# Patient Record
Sex: Male | Born: 1960 | Race: White | Hispanic: No | Smoking: Current every day smoker
Health system: Southern US, Community
[De-identification: ages and names within clinical notes are randomized; demographics above are authoritative.]

## PROBLEM LIST (undated history)

## (undated) DIAGNOSIS — J439 Emphysema, unspecified: Secondary | ICD-10-CM

## (undated) HISTORY — DX: Emphysema, unspecified: J43.9

---

## 2002-10-13 ENCOUNTER — Emergency Department (HOSPITAL_COMMUNITY): Admission: EM | Admit: 2002-10-13 | Discharge: 2002-10-13 | Payer: Self-pay | Admitting: Emergency Medicine

## 2002-10-13 ENCOUNTER — Encounter: Payer: Self-pay | Admitting: Emergency Medicine

## 2005-01-16 ENCOUNTER — Inpatient Hospital Stay (HOSPITAL_COMMUNITY): Admission: EM | Admit: 2005-01-16 | Discharge: 2005-01-18 | Payer: Self-pay | Admitting: *Deleted

## 2005-01-30 ENCOUNTER — Inpatient Hospital Stay (HOSPITAL_COMMUNITY): Admission: EM | Admit: 2005-01-30 | Discharge: 2005-02-03 | Payer: Self-pay | Admitting: Emergency Medicine

## 2005-01-30 ENCOUNTER — Ambulatory Visit: Payer: Self-pay | Admitting: Physical Medicine & Rehabilitation

## 2006-04-06 ENCOUNTER — Encounter: Payer: Self-pay | Admitting: Emergency Medicine

## 2020-08-13 ENCOUNTER — Emergency Department (HOSPITAL_COMMUNITY): Payer: Self-pay

## 2020-08-13 ENCOUNTER — Other Ambulatory Visit: Payer: Self-pay

## 2020-08-13 ENCOUNTER — Emergency Department (HOSPITAL_COMMUNITY)
Admission: EM | Admit: 2020-08-13 | Discharge: 2020-08-14 | Disposition: A | Discharge status: Home / Self Care | Payer: Self-pay | Attending: Emergency Medicine | Admitting: Emergency Medicine

## 2020-08-13 DIAGNOSIS — Y998 Other external cause status: Secondary | ICD-10-CM | POA: Insufficient documentation

## 2020-08-13 DIAGNOSIS — S40811A Abrasion of right upper arm, initial encounter: Secondary | ICD-10-CM

## 2020-08-13 DIAGNOSIS — S0101XA Laceration without foreign body of scalp, initial encounter: Secondary | ICD-10-CM | POA: Insufficient documentation

## 2020-08-13 DIAGNOSIS — S2242XA Multiple fractures of ribs, left side, initial encounter for closed fracture: Secondary | ICD-10-CM

## 2020-08-13 DIAGNOSIS — S161XXA Strain of muscle, fascia and tendon at neck level, initial encounter: Secondary | ICD-10-CM

## 2020-08-13 DIAGNOSIS — Y929 Unspecified place or not applicable: Secondary | ICD-10-CM | POA: Insufficient documentation

## 2020-08-13 DIAGNOSIS — S0990XA Unspecified injury of head, initial encounter: Secondary | ICD-10-CM

## 2020-08-13 DIAGNOSIS — S40011A Contusion of right shoulder, initial encounter: Secondary | ICD-10-CM

## 2020-08-13 DIAGNOSIS — Y939 Activity, unspecified: Secondary | ICD-10-CM | POA: Insufficient documentation

## 2020-08-13 MED ORDER — LIDOCAINE HCL (PF) 1 % IJ SOLN
5.0000 mL | Freq: Once | INTRAMUSCULAR | Status: DC
  Filled 2020-08-13: qty 5

## 2020-08-13 MED ORDER — MORPHINE SULFATE (PF) 4 MG/ML IV SOLN
4.0000 mg | Freq: Once | INTRAVENOUS | Status: AC
  Administered 2020-08-13: 4 mg via INTRAVENOUS
  Filled 2020-08-13: qty 1

## 2020-08-13 MED ORDER — ONDANSETRON HCL 4 MG/2ML IJ SOLN
4.0000 mg | Freq: Once | INTRAMUSCULAR | Status: AC
  Administered 2020-08-13: 4 mg via INTRAVENOUS
  Filled 2020-08-13: qty 2

## 2020-08-13 MED ORDER — SODIUM CHLORIDE 0.9 % IV BOLUS
1000.0000 mL | Freq: Once | INTRAVENOUS | Status: AC
  Administered 2020-08-13: 1000 mL via INTRAVENOUS
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
## 2020-08-14 ENCOUNTER — Emergency Department (HOSPITAL_COMMUNITY): Payer: Self-pay

## 2020-08-14 LAB — BASIC METABOLIC PANEL
Anion gap: 14 (ref 5–15)
BUN: 5 mg/dL — ABNORMAL LOW (ref 6–20)
CO2: 21 mmol/L — ABNORMAL LOW (ref 22–32)
Calcium: 7.9 mg/dL — ABNORMAL LOW (ref 8.9–10.3)
Chloride: 103 mmol/L (ref 98–111)
Creatinine, Ser: 0.69 mg/dL (ref 0.61–1.24)
GFR calc Af Amer: >60 mL/min (ref >60)
GFR calc non Af Amer: >60 mL/min (ref >60)
Glucose, Bld: 92 mg/dL (ref 70–99)
Potassium: 3.8 mmol/L (ref 3.5–5.1)
Sodium: 138 mmol/L (ref 135–145)

## 2020-08-14 LAB — CBC WITH DIFFERENTIAL/PLATELET
Abs Immature Granulocytes: 0.05 10*3/uL (ref 0.00–0.07)
Basophils Absolute: 0.1 10*3/uL (ref 0.0–0.1)
Basophils Relative: 1 %
Eosinophils Absolute: 0.2 10*3/uL (ref 0.0–0.5)
Eosinophils Relative: 3 %
HCT: 43.8 % (ref 39.0–52.0)
Hemoglobin: 15.4 g/dL (ref 13.0–17.0)
Immature Granulocytes: 1 %
Lymphocytes Relative: 27 %
Lymphs Abs: 1.8 10*3/uL (ref 0.7–4.0)
MCH: 39.6 pg — ABNORMAL HIGH (ref 26.0–34.0)
MCHC: 35.2 g/dL (ref 30.0–36.0)
MCV: 112.6 fL — ABNORMAL HIGH (ref 80.0–100.0)
Monocytes Absolute: 0.8 10*3/uL (ref 0.1–1.0)
Monocytes Relative: 12 %
Neutro Abs: 3.6 10*3/uL (ref 1.7–7.7)
Neutrophils Relative %: 56 %
Platelets: 221 10*3/uL (ref 150–400)
RBC: 3.89 MIL/uL — ABNORMAL LOW (ref 4.22–5.81)
RDW: 13.2 % (ref 11.5–15.5)
WBC: 6.4 10*3/uL (ref 4.0–10.5)
nRBC: 0.3 % — ABNORMAL HIGH (ref 0.0–0.2)

## 2020-08-14 LAB — ETHANOL: Alcohol, Ethyl (B): 221 mg/dL — ABNORMAL HIGH (ref <10)

## 2020-08-14 MED ORDER — IOHEXOL 300 MG/ML  SOLN
100.0000 mL | Freq: Once | INTRAMUSCULAR | Status: AC | PRN
  Administered 2020-08-14: 100 mL via INTRAVENOUS

## 2020-08-14 MED ORDER — OXYCODONE-ACETAMINOPHEN 5-325 MG PO TABS: 1.0000 | ORAL_TABLET | Freq: Four times a day (QID) | ORAL | 0 refills | Status: DC | PRN

## 2020-08-14 MED ORDER — LIDOCAINE HCL (PF) 1 % IJ SOLN
INTRAMUSCULAR | Status: AC
  Filled 2020-08-14: qty 5

## 2020-08-14 MED ORDER — MORPHINE SULFATE (PF) 4 MG/ML IV SOLN
INTRAVENOUS | Status: AC
Start: 2020-08-14 — End: 2020-08-14
  Filled 2020-08-14: qty 1

## 2020-08-14 NOTE — ED Notes (Signed)
Patient transported to CT 

## 2020-08-14 NOTE — Discharge Instructions (Signed)
Begin taking Percocet as prescribed as needed for pain.  Rest.  Ice for 20 minutes every 2 hours while awake for the next 2 days.  Local wound care with bacitracin and dressing changes twice daily.  Staples are to be removed in 5 to 6 days.  Please follow-up with your primary doctor or urgent care for this.  Return to the emergency department if you develop any new and/or concerning symptoms.
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
## 2023-04-23 ENCOUNTER — Emergency Department (HOSPITAL_COMMUNITY): Payer: Self-pay

## 2023-04-23 ENCOUNTER — Encounter (HOSPITAL_COMMUNITY): Payer: Self-pay

## 2023-04-23 ENCOUNTER — Other Ambulatory Visit: Payer: Self-pay

## 2023-04-23 ENCOUNTER — Inpatient Hospital Stay (HOSPITAL_COMMUNITY)
Admission: EM | Admit: 2023-04-23 | Discharge: 2023-04-26 | DRG: 184 | Disposition: A | Discharge status: Home / Self Care | Payer: Self-pay | Attending: General Surgery | Admitting: General Surgery

## 2023-04-23 DIAGNOSIS — S2243XA Multiple fractures of ribs, bilateral, initial encounter for closed fracture: Secondary | ICD-10-CM | POA: Diagnosis not present

## 2023-04-23 DIAGNOSIS — I4891 Unspecified atrial fibrillation: Secondary | ICD-10-CM | POA: Diagnosis present

## 2023-04-23 DIAGNOSIS — F17210 Nicotine dependence, cigarettes, uncomplicated: Secondary | ICD-10-CM | POA: Diagnosis present

## 2023-04-23 DIAGNOSIS — E876 Hypokalemia: Secondary | ICD-10-CM

## 2023-04-23 DIAGNOSIS — Y906 Blood alcohol level of 120-199 mg/100 ml: Secondary | ICD-10-CM | POA: Diagnosis present

## 2023-04-23 DIAGNOSIS — M79602 Pain in left arm: Secondary | ICD-10-CM | POA: Diagnosis not present

## 2023-04-23 DIAGNOSIS — R202 Paresthesia of skin: Secondary | ICD-10-CM | POA: Diagnosis present

## 2023-04-23 DIAGNOSIS — R001 Bradycardia, unspecified: Secondary | ICD-10-CM | POA: Diagnosis not present

## 2023-04-23 DIAGNOSIS — R200 Anesthesia of skin: Secondary | ICD-10-CM | POA: Diagnosis present

## 2023-04-23 DIAGNOSIS — F1010 Alcohol abuse, uncomplicated: Secondary | ICD-10-CM

## 2023-04-23 DIAGNOSIS — F1020 Alcohol dependence, uncomplicated: Secondary | ICD-10-CM | POA: Diagnosis present

## 2023-04-23 DIAGNOSIS — S2249XA Multiple fractures of ribs, unspecified side, initial encounter for closed fracture: Secondary | ICD-10-CM | POA: Diagnosis present

## 2023-04-23 DIAGNOSIS — I340 Nonrheumatic mitral (valve) insufficiency: Secondary | ICD-10-CM | POA: Diagnosis present

## 2023-04-23 DIAGNOSIS — Y92410 Unspecified street and highway as the place of occurrence of the external cause: Secondary | ICD-10-CM

## 2023-04-23 DIAGNOSIS — K561 Intussusception: Secondary | ICD-10-CM

## 2023-04-23 LAB — COMPREHENSIVE METABOLIC PANEL
ALT: 19 U/L (ref 0–44)
AST: 35 U/L (ref 15–41)
Albumin: 3.3 g/dL — ABNORMAL LOW (ref 3.5–5.0)
Alkaline Phosphatase: 61 U/L (ref 38–126)
Anion gap: 10 (ref 5–15)
BUN: 5 mg/dL — ABNORMAL LOW (ref 8–23)
CO2: 26 mmol/L (ref 22–32)
Calcium: 8.2 mg/dL — ABNORMAL LOW (ref 8.9–10.3)
Chloride: 103 mmol/L (ref 98–111)
Creatinine, Ser: 0.61 mg/dL (ref 0.61–1.24)
GFR, Estimated: >60 mL/min (ref >60)
Glucose, Bld: 111 mg/dL — ABNORMAL HIGH (ref 70–99)
Potassium: 2.4 mmol/L — CL (ref 3.5–5.1)
Sodium: 139 mmol/L (ref 135–145)
Total Bilirubin: 0.5 mg/dL (ref 0.3–1.2)
Total Protein: 6.9 g/dL (ref 6.5–8.1)

## 2023-04-23 LAB — I-STAT CHEM 8, ED
BUN: 6 mg/dL — ABNORMAL LOW (ref 8–23)
Calcium, Ion: 1.04 mmol/L — ABNORMAL LOW (ref 1.15–1.40)
Chloride: 101 mmol/L (ref 98–111)
Creatinine, Ser: 0.9 mg/dL (ref 0.61–1.24)
Glucose, Bld: 111 mg/dL — ABNORMAL HIGH (ref 70–99)
HCT: 44 % (ref 39.0–52.0)
Hemoglobin: 15 g/dL (ref 13.0–17.0)
Potassium: 2.6 mmol/L — CL (ref 3.5–5.1)
Sodium: 143 mmol/L (ref 135–145)
TCO2: 25 mmol/L (ref 22–32)

## 2023-04-23 LAB — CBC
HCT: 39.7 % (ref 39.0–52.0)
Hemoglobin: 13.8 g/dL (ref 13.0–17.0)
MCH: 37.3 pg — ABNORMAL HIGH (ref 26.0–34.0)
MCHC: 34.8 g/dL (ref 30.0–36.0)
MCV: 107.3 fL — ABNORMAL HIGH (ref 80.0–100.0)
Platelets: 240 10*3/uL (ref 150–400)
RBC: 3.7 MIL/uL — ABNORMAL LOW (ref 4.22–5.81)
RDW: 14.1 % (ref 11.5–15.5)
WBC: 4.6 10*3/uL (ref 4.0–10.5)
nRBC: 0 % (ref 0.0–0.2)

## 2023-04-23 LAB — PROTIME-INR
INR: 1.1 (ref 0.8–1.2)
Prothrombin Time: 14.5 seconds (ref 11.4–15.2)

## 2023-04-23 LAB — SAMPLE TO BLOOD BANK

## 2023-04-23 LAB — AMMONIA: Ammonia: 45 umol/L — ABNORMAL HIGH (ref 9–35)

## 2023-04-23 LAB — MAGNESIUM: Magnesium: 1.8 mg/dL (ref 1.7–2.4)

## 2023-04-23 MED ORDER — MORPHINE SULFATE (PF) 4 MG/ML IV SOLN
4.0000 mg | Freq: Once | INTRAVENOUS | Status: AC
  Administered 2023-04-23: 4 mg via INTRAVENOUS
  Filled 2023-04-23: qty 1

## 2023-04-23 MED ORDER — LIDOCAINE 5 % EX PTCH
1.0000 | MEDICATED_PATCH | CUTANEOUS | Status: DC
  Administered 2023-04-23 – 2023-04-25 (×3): 1 via TRANSDERMAL
  Filled 2023-04-23 (×3): qty 1

## 2023-04-23 MED ORDER — SODIUM CHLORIDE 0.9 % IV BOLUS
1000.0000 mL | Freq: Once | INTRAVENOUS | Status: AC
  Administered 2023-04-23: 1000 mL via INTRAVENOUS

## 2023-04-23 MED ORDER — POTASSIUM CHLORIDE 10 MEQ/100ML IV SOLN
10.0000 meq | INTRAVENOUS | Status: AC
  Administered 2023-04-23 – 2023-04-24 (×3): 10 meq via INTRAVENOUS
  Filled 2023-04-23 (×3): qty 100

## 2023-04-23 MED ORDER — IOHEXOL 350 MG/ML SOLN
75.0000 mL | Freq: Once | INTRAVENOUS | Status: AC | PRN
  Administered 2023-04-23: 75 mL via INTRAVENOUS

## 2023-04-23 MED ORDER — POTASSIUM CHLORIDE 20 MEQ PO PACK
80.0000 meq | PACK | ORAL | Status: AC
  Administered 2023-04-23: 80 meq via ORAL
  Filled 2023-04-23: qty 4

## 2023-04-23 MED ORDER — ONDANSETRON HCL 4 MG/2ML IJ SOLN
4.0000 mg | Freq: Once | INTRAMUSCULAR | Status: AC
  Administered 2023-04-23: 4 mg via INTRAVENOUS
  Filled 2023-04-23: qty 2

## 2023-04-23 MED ORDER — LACTATED RINGERS IV BOLUS: 1000.0000 mL | Freq: Once | INTRAVENOUS | Status: DC

## 2023-04-23 NOTE — ED Notes (Signed)
Sister Steward Drone [phone] would like an update asap
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
## 2023-04-23 NOTE — ED Triage Notes (Signed)
Pt reports loss of sensation to his L arm and feet. Pt does move arm with painful stimulus

## 2023-04-23 NOTE — ED Notes (Signed)
Spencer Chapman brother:  [phone]

## 2023-04-23 NOTE — ED Notes (Signed)
I stat chem 8 results given to EDP and nurse.

## 2023-04-24 DIAGNOSIS — I4891 Unspecified atrial fibrillation: Secondary | ICD-10-CM | POA: Diagnosis present

## 2023-04-24 DIAGNOSIS — Y906 Blood alcohol level of 120-199 mg/100 ml: Secondary | ICD-10-CM | POA: Diagnosis present

## 2023-04-24 DIAGNOSIS — F17210 Nicotine dependence, cigarettes, uncomplicated: Secondary | ICD-10-CM | POA: Diagnosis present

## 2023-04-24 DIAGNOSIS — R202 Paresthesia of skin: Secondary | ICD-10-CM | POA: Diagnosis present

## 2023-04-24 DIAGNOSIS — S2243XA Multiple fractures of ribs, bilateral, initial encounter for closed fracture: Secondary | ICD-10-CM | POA: Diagnosis present

## 2023-04-24 DIAGNOSIS — F1020 Alcohol dependence, uncomplicated: Secondary | ICD-10-CM | POA: Diagnosis present

## 2023-04-24 DIAGNOSIS — Y92410 Unspecified street and highway as the place of occurrence of the external cause: Secondary | ICD-10-CM | POA: Diagnosis not present

## 2023-04-24 DIAGNOSIS — S2249XA Multiple fractures of ribs, unspecified side, initial encounter for closed fracture: Secondary | ICD-10-CM | POA: Diagnosis present

## 2023-04-24 DIAGNOSIS — K561 Intussusception: Secondary | ICD-10-CM | POA: Diagnosis present

## 2023-04-24 DIAGNOSIS — R200 Anesthesia of skin: Secondary | ICD-10-CM | POA: Diagnosis present

## 2023-04-24 DIAGNOSIS — E876 Hypokalemia: Secondary | ICD-10-CM | POA: Diagnosis present

## 2023-04-24 DIAGNOSIS — M79602 Pain in left arm: Secondary | ICD-10-CM | POA: Diagnosis present

## 2023-04-24 DIAGNOSIS — R001 Bradycardia, unspecified: Secondary | ICD-10-CM | POA: Diagnosis not present

## 2023-04-24 DIAGNOSIS — I340 Nonrheumatic mitral (valve) insufficiency: Secondary | ICD-10-CM | POA: Diagnosis present

## 2023-04-24 LAB — URINALYSIS, ROUTINE W REFLEX MICROSCOPIC
Bilirubin Urine: NEGATIVE
Glucose, UA: NEGATIVE mg/dL
Hgb urine dipstick: NEGATIVE
Ketones, ur: NEGATIVE mg/dL
Leukocytes,Ua: NEGATIVE
Nitrite: NEGATIVE
Protein, ur: NEGATIVE mg/dL
Specific Gravity, Urine: 1.036 — ABNORMAL HIGH (ref 1.005–1.030)
pH: 6 (ref 5.0–8.0)

## 2023-04-24 LAB — BASIC METABOLIC PANEL
Anion gap: 9 (ref 5–15)
BUN: 6 mg/dL — ABNORMAL LOW (ref 8–23)
CO2: 24 mmol/L (ref 22–32)
Calcium: 7.9 mg/dL — ABNORMAL LOW (ref 8.9–10.3)
Chloride: 107 mmol/L (ref 98–111)
Creatinine, Ser: 0.56 mg/dL — ABNORMAL LOW (ref 0.61–1.24)
GFR, Estimated: >60 mL/min (ref >60)
Glucose, Bld: 92 mg/dL (ref 70–99)
Potassium: 4.2 mmol/L (ref 3.5–5.1)
Sodium: 140 mmol/L (ref 135–145)

## 2023-04-24 LAB — RAPID URINE DRUG SCREEN, HOSP PERFORMED
Amphetamines: NOT DETECTED
Barbiturates: NOT DETECTED
Benzodiazepines: NOT DETECTED
Cocaine: NOT DETECTED
Opiates: POSITIVE — AB
Tetrahydrocannabinol: NOT DETECTED

## 2023-04-24 LAB — CBC
HCT: 34.5 % — ABNORMAL LOW (ref 39.0–52.0)
Hemoglobin: 12 g/dL — ABNORMAL LOW (ref 13.0–17.0)
MCH: 37.2 pg — ABNORMAL HIGH (ref 26.0–34.0)
MCHC: 34.8 g/dL (ref 30.0–36.0)
MCV: 106.8 fL — ABNORMAL HIGH (ref 80.0–100.0)
Platelets: 211 10*3/uL (ref 150–400)
RBC: 3.23 MIL/uL — ABNORMAL LOW (ref 4.22–5.81)
RDW: 14.2 % (ref 11.5–15.5)
WBC: 7.5 10*3/uL (ref 4.0–10.5)
nRBC: 0 % (ref 0.0–0.2)

## 2023-04-24 LAB — HIV ANTIBODY (ROUTINE TESTING W REFLEX): HIV Screen 4th Generation wRfx: NONREACTIVE

## 2023-04-24 LAB — PHOSPHORUS: Phosphorus: 3.8 mg/dL (ref 2.5–4.6)

## 2023-04-24 LAB — ETHANOL: Alcohol, Ethyl (B): 166 mg/dL — ABNORMAL HIGH (ref <10)

## 2023-04-24 LAB — MAGNESIUM: Magnesium: 1.6 mg/dL — ABNORMAL LOW (ref 1.7–2.4)

## 2023-04-24 MED ORDER — POLYETHYLENE GLYCOL 3350 17 G PO PACK: 17.0000 g | PACK | Freq: Every day | ORAL | Status: DC | PRN

## 2023-04-24 MED ORDER — ACETAMINOPHEN 325 MG PO TABS: 650.0000 mg | ORAL_TABLET | Freq: Four times a day (QID) | ORAL | Status: DC

## 2023-04-24 MED ORDER — PANTOPRAZOLE SODIUM 40 MG PO TBEC
40.0000 mg | DELAYED_RELEASE_TABLET | Freq: Every day | ORAL | Status: DC
  Administered 2023-04-24 – 2023-04-26 (×3): 40 mg via ORAL
  Filled 2023-04-24 (×3): qty 1

## 2023-04-24 MED ORDER — ADULT MULTIVITAMIN W/MINERALS CH
1.0000 | ORAL_TABLET | Freq: Every day | ORAL | Status: DC
  Administered 2023-04-24 – 2023-04-26 (×3): 1 via ORAL
  Filled 2023-04-24 (×3): qty 1

## 2023-04-24 MED ORDER — ONDANSETRON 4 MG PO TBDP: 4.0000 mg | ORAL_TABLET | Freq: Four times a day (QID) | ORAL | Status: DC | PRN

## 2023-04-24 MED ORDER — DOCUSATE SODIUM 100 MG PO CAPS
100.0000 mg | ORAL_CAPSULE | Freq: Two times a day (BID) | ORAL | Status: DC
  Administered 2023-04-24 – 2023-04-26 (×5): 100 mg via ORAL
  Filled 2023-04-24 (×5): qty 1

## 2023-04-24 MED ORDER — LORAZEPAM 2 MG/ML IJ SOLN: 1.0000 mg | INTRAMUSCULAR | Status: DC | PRN

## 2023-04-24 MED ORDER — SIMETHICONE 80 MG PO CHEW: 80.0000 mg | CHEWABLE_TABLET | Freq: Four times a day (QID) | ORAL | Status: DC | PRN

## 2023-04-24 MED ORDER — METOPROLOL TARTRATE 5 MG/5ML IV SOLN: 5.0000 mg | Freq: Four times a day (QID) | INTRAVENOUS | Status: DC | PRN

## 2023-04-24 MED ORDER — METHOCARBAMOL 1000 MG/10ML IJ SOLN
500.0000 mg | Freq: Three times a day (TID) | INTRAVENOUS | Status: DC
  Filled 2023-04-24: qty 5

## 2023-04-24 MED ORDER — PROCHLORPERAZINE EDISYLATE 10 MG/2ML IJ SOLN: 10.0000 mg | INTRAMUSCULAR | Status: DC | PRN

## 2023-04-24 MED ORDER — HYDRALAZINE HCL 20 MG/ML IJ SOLN: 10.0000 mg | INTRAMUSCULAR | Status: DC | PRN

## 2023-04-24 MED ORDER — METHOCARBAMOL 500 MG PO TABS
500.0000 mg | ORAL_TABLET | Freq: Three times a day (TID) | ORAL | Status: DC
  Administered 2023-04-24 – 2023-04-26 (×8): 500 mg via ORAL
  Filled 2023-04-24 (×8): qty 1

## 2023-04-24 MED ORDER — ACETAMINOPHEN 500 MG PO TABS
1000.0000 mg | ORAL_TABLET | Freq: Four times a day (QID) | ORAL | Status: DC
  Administered 2023-04-24 – 2023-04-26 (×9): 1000 mg via ORAL
  Filled 2023-04-24 (×10): qty 2

## 2023-04-24 MED ORDER — OXYCODONE HCL 5 MG PO TABS
10.0000 mg | ORAL_TABLET | ORAL | Status: DC | PRN
  Filled 2023-04-24: qty 2

## 2023-04-24 MED ORDER — METHOCARBAMOL 1000 MG/10ML IJ SOLN: 500.0000 mg | Freq: Four times a day (QID) | INTRAVENOUS | Status: DC | PRN

## 2023-04-24 MED ORDER — ONDANSETRON HCL 4 MG/2ML IJ SOLN: 4.0000 mg | Freq: Four times a day (QID) | INTRAMUSCULAR | Status: DC | PRN

## 2023-04-24 MED ORDER — FOLIC ACID 1 MG PO TABS
1.0000 mg | ORAL_TABLET | Freq: Every day | ORAL | Status: DC
  Administered 2023-04-24 – 2023-04-26 (×3): 1 mg via ORAL
  Filled 2023-04-24 (×3): qty 1

## 2023-04-24 MED ORDER — THIAMINE MONONITRATE 100 MG PO TABS
100.0000 mg | ORAL_TABLET | Freq: Every day | ORAL | Status: DC
  Administered 2023-04-24 – 2023-04-26 (×3): 100 mg via ORAL
  Filled 2023-04-24 (×3): qty 1

## 2023-04-24 MED ORDER — HYDROMORPHONE HCL 1 MG/ML IJ SOLN: 1.0000 mg | INTRAMUSCULAR | Status: DC | PRN

## 2023-04-24 MED ORDER — KETOROLAC TROMETHAMINE 15 MG/ML IJ SOLN
15.0000 mg | Freq: Three times a day (TID) | INTRAMUSCULAR | Status: DC
  Administered 2023-04-24 – 2023-04-26 (×8): 15 mg via INTRAVENOUS
  Filled 2023-04-24 (×8): qty 1

## 2023-04-24 MED ORDER — METOCLOPRAMIDE HCL 5 MG/ML IJ SOLN
10.0000 mg | Freq: Four times a day (QID) | INTRAMUSCULAR | Status: DC
  Administered 2023-04-24 (×2): 10 mg via INTRAVENOUS
  Filled 2023-04-24 (×2): qty 2

## 2023-04-24 MED ORDER — THIAMINE HCL 100 MG/ML IJ SOLN
100.0000 mg | Freq: Every day | INTRAMUSCULAR | Status: DC
  Filled 2023-04-24: qty 2

## 2023-04-24 MED ORDER — ENOXAPARIN SODIUM 30 MG/0.3ML IJ SOSY
30.0000 mg | PREFILLED_SYRINGE | Freq: Two times a day (BID) | INTRAMUSCULAR | Status: DC
  Administered 2023-04-25 – 2023-04-26 (×3): 30 mg via SUBCUTANEOUS
  Filled 2023-04-24 (×3): qty 0.3

## 2023-04-24 MED ORDER — GABAPENTIN 300 MG PO CAPS
300.0000 mg | ORAL_CAPSULE | Freq: Three times a day (TID) | ORAL | Status: DC
  Administered 2023-04-24: 300 mg via ORAL
  Filled 2023-04-24: qty 1

## 2023-04-24 MED ORDER — DOCUSATE SODIUM 100 MG PO CAPS: 100.0000 mg | ORAL_CAPSULE | Freq: Two times a day (BID) | ORAL | Status: DC

## 2023-04-24 MED ORDER — LORAZEPAM 1 MG PO TABS: 1.0000 mg | ORAL_TABLET | ORAL | Status: DC | PRN

## 2023-04-24 MED ORDER — OXYCODONE HCL 5 MG PO TABS
5.0000 mg | ORAL_TABLET | ORAL | Status: DC | PRN
  Administered 2023-04-25 – 2023-04-26 (×2): 5 mg via ORAL
  Filled 2023-04-24: qty 1

## 2023-04-24 MED ORDER — PANTOPRAZOLE SODIUM 40 MG IV SOLR
40.0000 mg | Freq: Every day | INTRAVENOUS | Status: DC
  Filled 2023-04-24: qty 10
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
## 2023-04-24 NOTE — Progress Notes (Signed)
Patient was transferred  to the  floor from ED,alert and oriented,able to verbalize needs,the patient assessment was done,made comfortable in room, patient had money in the amount of $110 in his wallet counted with patient and informed the charge nurse,bed in low position and call light in reach,will continue to monitor.
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
## 2023-04-25 ENCOUNTER — Inpatient Hospital Stay (HOSPITAL_COMMUNITY): Payer: Self-pay

## 2023-04-25 DIAGNOSIS — R011 Cardiac murmur, unspecified: Secondary | ICD-10-CM

## 2023-04-25 DIAGNOSIS — I4891 Unspecified atrial fibrillation: Secondary | ICD-10-CM

## 2023-04-25 DIAGNOSIS — I491 Atrial premature depolarization: Secondary | ICD-10-CM

## 2023-04-25 DIAGNOSIS — Z720 Tobacco use: Secondary | ICD-10-CM

## 2023-04-25 DIAGNOSIS — F1010 Alcohol abuse, uncomplicated: Secondary | ICD-10-CM

## 2023-04-25 LAB — BASIC METABOLIC PANEL
Anion gap: 7 (ref 5–15)
BUN: 11 mg/dL (ref 8–23)
CO2: 24 mmol/L (ref 22–32)
Calcium: 8.1 mg/dL — ABNORMAL LOW (ref 8.9–10.3)
Chloride: 102 mmol/L (ref 98–111)
Creatinine, Ser: 0.71 mg/dL (ref 0.61–1.24)
GFR, Estimated: >60 mL/min (ref >60)
Glucose, Bld: 93 mg/dL (ref 70–99)
Potassium: 3.8 mmol/L (ref 3.5–5.1)
Sodium: 133 mmol/L — ABNORMAL LOW (ref 135–145)

## 2023-04-25 LAB — PHOSPHORUS: Phosphorus: 2.9 mg/dL (ref 2.5–4.6)

## 2023-04-25 LAB — ECHOCARDIOGRAM COMPLETE
AR max vel: 2.08 cm2
AV Area VTI: 2.17 cm2
AV Area mean vel: 2.09 cm2
AV Mean grad: 3 mmHg
AV Peak grad: 6.5 mmHg
Ao pk vel: 1.27 m/s
Area-P 1/2: 3.28 cm2
Calc EF: 58.6 %
MV VTI: 1.35 cm2
S' Lateral: 3.1 cm
Single Plane A2C EF: 58.3 %
Single Plane A4C EF: 58 %
Weight: 1777.79 oz

## 2023-04-25 LAB — CBC
HCT: 33.1 % — ABNORMAL LOW (ref 39.0–52.0)
Hemoglobin: 11.5 g/dL — ABNORMAL LOW (ref 13.0–17.0)
MCH: 36.7 pg — ABNORMAL HIGH (ref 26.0–34.0)
MCHC: 34.7 g/dL (ref 30.0–36.0)
MCV: 105.8 fL — ABNORMAL HIGH (ref 80.0–100.0)
Platelets: 183 10*3/uL (ref 150–400)
RBC: 3.13 MIL/uL — ABNORMAL LOW (ref 4.22–5.81)
RDW: 13.8 % (ref 11.5–15.5)
WBC: 7.9 10*3/uL (ref 4.0–10.5)
nRBC: 0 % (ref 0.0–0.2)

## 2023-04-25 LAB — MAGNESIUM: Magnesium: 1.5 mg/dL — ABNORMAL LOW (ref 1.7–2.4)

## 2023-04-25 MED ORDER — MAGNESIUM SULFATE 4 GM/100ML IV SOLN
4.0000 g | Freq: Once | INTRAVENOUS | Status: AC
  Administered 2023-04-25: 4 g via INTRAVENOUS
  Filled 2023-04-25: qty 100

## 2023-04-25 MED ORDER — SPIRITUS FRUMENTI
1.0000 | Freq: Once | ORAL | Status: DC
  Filled 2023-04-25 (×2): qty 1

## 2023-04-25 NOTE — Progress Notes (Signed)
ECHO being performed on pt in the room.
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
## 2023-04-25 NOTE — TOC CAGE-AID Note (Signed)
Transition of Care [facility]) - CAGE-AID Screening  Patient Details  Name: Spencer Chapman MRN: [id number] Date of Birth: 06/17/1961  Clinical Narrative:  Patient endorses drinking "a lot every day", has never had withdrawal symptoms. Patient feels he should probably cut down. Denies any illicit drug use. Resources offered.  CAGE-AID Screening:    Have You Ever Felt You Ought to Cut Down on Your Drinking or Drug Use?: Yes Have People Annoyed You By Critizing Your Drinking Or Drug Use?: No Have You Felt Bad Or Guilty About Your Drinking Or Drug Use?: No Have You Ever Had a Drink or Used Drugs First Thing In The Morning to Steady Your Nerves or to Get Rid of a Hangover?: No CAGE-AID Score: 1  Substance Abuse Education Offered: Yes
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
## 2023-04-26 ENCOUNTER — Inpatient Hospital Stay: Payer: Self-pay

## 2023-04-26 ENCOUNTER — Other Ambulatory Visit (HOSPITAL_COMMUNITY): Payer: Self-pay

## 2023-04-26 ENCOUNTER — Other Ambulatory Visit: Payer: Self-pay | Admitting: Home Health

## 2023-04-26 ENCOUNTER — Encounter: Payer: Self-pay | Admitting: *Deleted

## 2023-04-26 DIAGNOSIS — I491 Atrial premature depolarization: Secondary | ICD-10-CM

## 2023-04-26 DIAGNOSIS — I340 Nonrheumatic mitral (valve) insufficiency: Secondary | ICD-10-CM

## 2023-04-26 DIAGNOSIS — I4891 Unspecified atrial fibrillation: Secondary | ICD-10-CM

## 2023-04-26 DIAGNOSIS — I341 Nonrheumatic mitral (valve) prolapse: Secondary | ICD-10-CM

## 2023-04-26 LAB — CBC
HCT: 33.8 % — ABNORMAL LOW (ref 39.0–52.0)
Hemoglobin: 12.1 g/dL — ABNORMAL LOW (ref 13.0–17.0)
MCH: 37.9 pg — ABNORMAL HIGH (ref 26.0–34.0)
MCHC: 35.8 g/dL (ref 30.0–36.0)
MCV: 106 fL — ABNORMAL HIGH (ref 80.0–100.0)
Platelets: 181 10*3/uL (ref 150–400)
RBC: 3.19 MIL/uL — ABNORMAL LOW (ref 4.22–5.81)
RDW: 13.8 % (ref 11.5–15.5)
WBC: 6.9 10*3/uL (ref 4.0–10.5)
nRBC: 0 % (ref 0.0–0.2)

## 2023-04-26 LAB — BASIC METABOLIC PANEL
Anion gap: 7 (ref 5–15)
BUN: 15 mg/dL (ref 8–23)
CO2: 24 mmol/L (ref 22–32)
Calcium: 8 mg/dL — ABNORMAL LOW (ref 8.9–10.3)
Chloride: 100 mmol/L (ref 98–111)
Creatinine, Ser: 0.75 mg/dL (ref 0.61–1.24)
GFR, Estimated: >60 mL/min (ref >60)
Glucose, Bld: 89 mg/dL (ref 70–99)
Potassium: 3.6 mmol/L (ref 3.5–5.1)
Sodium: 131 mmol/L — ABNORMAL LOW (ref 135–145)

## 2023-04-26 LAB — MAGNESIUM: Magnesium: 2 mg/dL (ref 1.7–2.4)

## 2023-04-26 MED ORDER — ACETAMINOPHEN 500 MG PO TABS: 1000.0000 mg | ORAL_TABLET | Freq: Four times a day (QID) | ORAL | 0 refills | Status: DC | PRN

## 2023-04-26 MED ORDER — METHOCARBAMOL 500 MG PO TABS
500.0000 mg | ORAL_TABLET | Freq: Three times a day (TID) | ORAL | 0 refills | Status: DC | PRN
  Filled 2023-04-26: qty 30, 10d supply, fill #0

## 2023-04-26 MED ORDER — OXYCODONE HCL 5 MG PO TABS
5.0000 mg | ORAL_TABLET | Freq: Four times a day (QID) | ORAL | 0 refills | Status: DC | PRN
  Filled 2023-04-26: qty 20, 5d supply, fill #0

## 2023-04-26 MED ORDER — MULTI-VITAMIN/MINERALS PO TABS
1.0000 | ORAL_TABLET | Freq: Every day | ORAL | 3 refills | Status: DC
  Filled 2023-04-26: qty 90, 90d supply, fill #0

## 2023-04-26 NOTE — Progress Notes (Unsigned)
Enrolled for [employer] to mail a ZIO XT long term holter monitor to the patients address on file.   Letter with instructions mailed to patient. 
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
## 2023-04-26 NOTE — Plan of Care (Signed)
  Problem: Clinical Measurements: Goal: Will remain free from infection Outcome: Progressing   Problem: Clinical Measurements: Goal: Respiratory complications will improve Outcome: Progressing   Problem: Clinical Measurements: Goal: Cardiovascular complication will be avoided Outcome: Progressing   Problem: Pain Managment: Goal: General experience of comfort will improve Outcome: Progressing   
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
## 2023-04-26 NOTE — Progress Notes (Signed)
Pt to discharge home today,...... have reached out to cardiology (secure chat)  to find out if pt will have ZIO patch placed here at the hospital or if will be placed as outpatient at the office.  Once this is clarified pt can be discharged according to the plan of the ZIO patch.  Await response.   Annice Needy, RN SWOT
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
## 2023-04-26 NOTE — Progress Notes (Signed)
Cardiology responded back, plan is for cardiology office to call the pt about the zio patch and mail it to the pt per NP,Xika Zhao.  This information was placed on the d/c instructions and discussed with the pt and significant other at the bedside.   Discharge instructions reviewed with pt and his significant other.   Copy of instructions given to pt. [facility] filled scripts and meds will be delivered to the pt in his room. Pt and significant other plan to call a taxi to take them home.  Pt to be d/c'd via wheelchair with belongings, with significant other.            To be escorted by staff.  Pablo Stauffer,RN SWOT
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
## 2023-04-27 ENCOUNTER — Telehealth: Payer: Self-pay | Admitting: *Deleted

## 2023-04-27 ENCOUNTER — Telehealth: Payer: Self-pay | Admitting: Radiology

## 2023-04-27 NOTE — Telephone Encounter (Signed)
We received an email from [facility] stating patients address in invalid in the [facility] system. I called patient and was unable to verify address.   ......... Good afternoon,  Our office received a ZIO XT Home Enrollment order from  office in which the address is reflecting invalid in USPS address verification system:  The address for this contact is not valid or could not be verified. USPS Details: Address Not Found.   Patients Initials: KM Patient [id number] Ticket:  We have reached out to the patient and the account. Please give [place] a call to confirm the patient address so we can proceed with shipping this order to the patient. IF YOU NEED ANY ADDITIONAL INFORMATION PLEASE CALL [employer] Customer Service [phone].  Thank you, [employer] Customer Support

## 2023-04-27 NOTE — Telephone Encounter (Signed)
Good afternoon,  Our office received a ZIO XT Home Enrollment order from  office in which the address is reflecting invalid in USPS address verification system:  The address for this contact is not valid or could not be verified. USPS Details: Address Not Found.   Patients Initials: KM Patient [id number] Ticket:  We have reached out to the patient and the account. Please give [place] a call to confirm the patient address so we can proceed with shipping this order to the patient. IF YOU NEED ANY ADDITIONAL INFORMATION PLEASE CALL [employer] Customer Service [phone].  Thank you, [employer] Customer Support    ref:[email or web address]:ref

## 2023-05-04 ENCOUNTER — Telehealth (INDEPENDENT_AMBULATORY_CARE_PROVIDER_SITE_OTHER): Payer: Self-pay | Admitting: Primary Care

## 2023-05-04 NOTE — Telephone Encounter (Signed)
Unable to leave VM

## 2023-05-05 ENCOUNTER — Inpatient Hospital Stay (INDEPENDENT_AMBULATORY_CARE_PROVIDER_SITE_OTHER): Payer: Self-pay | Admitting: Primary Care

## 2023-07-20 ENCOUNTER — Other Ambulatory Visit (INDEPENDENT_AMBULATORY_CARE_PROVIDER_SITE_OTHER): Payer: Medicaid Other

## 2023-07-20 ENCOUNTER — Encounter: Payer: Self-pay | Admitting: Family Medicine

## 2023-07-20 ENCOUNTER — Ambulatory Visit: Payer: Medicaid Other | Admitting: Family Medicine

## 2023-07-20 VITALS — BP 130/70 | HR 66 | Temp 98.6°F | Ht 69.0 in | Wt 115.0 lb

## 2023-07-20 DIAGNOSIS — Z131 Encounter for screening for diabetes mellitus: Secondary | ICD-10-CM

## 2023-07-20 DIAGNOSIS — F17210 Nicotine dependence, cigarettes, uncomplicated: Secondary | ICD-10-CM

## 2023-07-20 DIAGNOSIS — Z1159 Encounter for screening for other viral diseases: Secondary | ICD-10-CM

## 2023-07-20 DIAGNOSIS — I341 Nonrheumatic mitral (valve) prolapse: Secondary | ICD-10-CM

## 2023-07-20 DIAGNOSIS — R29898 Other symptoms and signs involving the musculoskeletal system: Secondary | ICD-10-CM | POA: Diagnosis not present

## 2023-07-20 DIAGNOSIS — I499 Cardiac arrhythmia, unspecified: Secondary | ICD-10-CM

## 2023-07-20 DIAGNOSIS — Z114 Encounter for screening for human immunodeficiency virus [HIV]: Secondary | ICD-10-CM

## 2023-07-20 DIAGNOSIS — I700 Atherosclerosis of aorta: Secondary | ICD-10-CM

## 2023-07-20 DIAGNOSIS — J439 Emphysema, unspecified: Secondary | ICD-10-CM | POA: Diagnosis not present

## 2023-07-20 DIAGNOSIS — R600 Localized edema: Secondary | ICD-10-CM | POA: Diagnosis not present

## 2023-07-20 DIAGNOSIS — Z125 Encounter for screening for malignant neoplasm of prostate: Secondary | ICD-10-CM

## 2023-07-20 DIAGNOSIS — Z23 Encounter for immunization: Secondary | ICD-10-CM | POA: Diagnosis not present

## 2023-07-20 DIAGNOSIS — Z1211 Encounter for screening for malignant neoplasm of colon: Secondary | ICD-10-CM

## 2023-07-20 DIAGNOSIS — Z7689 Persons encountering health services in other specified circumstances: Secondary | ICD-10-CM

## 2023-07-20 DIAGNOSIS — Z13220 Encounter for screening for lipoid disorders: Secondary | ICD-10-CM

## 2023-07-20 DIAGNOSIS — Z1329 Encounter for screening for other suspected endocrine disorder: Secondary | ICD-10-CM

## 2023-07-20 NOTE — Assessment & Plan Note (Signed)
Weakness and numbness of bilateral lower extremities and hands. Will refer for nerve conduction studies and will consider MRI spine. He has known L1 compression fracture.

## 2023-07-20 NOTE — Assessment & Plan Note (Signed)
Today we reviewed your PMH and current concerns. Health maintenance items discussed. Fasting labs drawn. Return to office in 1 week for complete physical.

## 2023-07-20 NOTE — Assessment & Plan Note (Signed)
Discussed findings on recent CT. Will optimize risk factors. Encouraged smoking cessation and limiting ETOH. Fasting labs drawn today. BP well controlled.
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
## 2023-07-20 NOTE — Assessment & Plan Note (Signed)
3-5 minute discussion regarding the harms of tobacco use, the benefits of cessation, and methods of cessation. Discussed that there are medication options to help with cessation. Provided printed education on steps to quit smoking. Patient is not ready for a medication to help.  

## 2023-07-20 NOTE — Assessment & Plan Note (Signed)
NSR with PACs on EKG in office. Consistent with findings during recent ED visit. Will reorder zio as it appears it was sent to wrong address. Provided sister with number to contact cardiology office for follow-up. Updated phone nmber and address in EPIC. Murmur on exam, recent echo showed mitral valve prolapse and recommended TEE. Will refer to cardiology. No chest pain, palpitatoins, shortness of breath, vision changes, recurerent headaches. He does endorse BLE swelling, will collect BNP in addition to standard labs.

## 2023-07-21 ENCOUNTER — Other Ambulatory Visit: Payer: Self-pay | Admitting: Family Medicine

## 2023-07-21 ENCOUNTER — Ambulatory Visit: Payer: Medicaid Other | Attending: Family Medicine

## 2023-07-21 ENCOUNTER — Encounter: Payer: Self-pay | Admitting: *Deleted

## 2023-07-21 DIAGNOSIS — I4891 Unspecified atrial fibrillation: Secondary | ICD-10-CM

## 2023-07-21 DIAGNOSIS — I499 Cardiac arrhythmia, unspecified: Secondary | ICD-10-CM

## 2023-07-21 DIAGNOSIS — I491 Atrial premature depolarization: Secondary | ICD-10-CM

## 2023-07-21 LAB — COMPLETE METABOLIC PANEL WITH GFR
AG Ratio: 1.1 (calc) (ref 1.0–2.5)
ALT: 12 U/L (ref 9–46)
AST: 31 U/L (ref 10–35)
Albumin: 3.6 g/dL (ref 3.6–5.1)
Alkaline phosphatase (APISO): 71 U/L (ref 35–144)
BUN/Creatinine Ratio: 8 (calc) (ref 6–22)
BUN: 5 mg/dL — ABNORMAL LOW (ref 7–25)
CO2: 28 mmol/L (ref 20–32)
Calcium: 8.9 mg/dL (ref 8.6–10.3)
Chloride: 102 mmol/L (ref 98–110)
Creat: 0.62 mg/dL — ABNORMAL LOW (ref 0.70–1.35)
Globulin: 3.2 g/dL (ref 1.9–3.7)
Glucose, Bld: 81 mg/dL (ref 65–99)
Potassium: 3.8 mmol/L (ref 3.5–5.3)
Sodium: 140 mmol/L (ref 135–146)
Total Bilirubin: 0.6 mg/dL (ref 0.2–1.2)
Total Protein: 6.8 g/dL (ref 6.1–8.1)
eGFR: 107 mL/min/{1.73_m2} (ref >=60)

## 2023-07-21 LAB — CBC WITH DIFFERENTIAL/PLATELET
Absolute Monocytes: 576 {cells}/uL (ref 200–950)
Basophils Absolute: 92 {cells}/uL (ref 0–200)
Basophils Relative: 1.8 %
Eosinophils Absolute: 306 {cells}/uL (ref 15–500)
Eosinophils Relative: 6 %
HCT: 39.8 % (ref 38.5–50.0)
Hemoglobin: 13.5 g/dL (ref 13.2–17.1)
Lymphs Abs: 1923 {cells}/uL (ref 850–3900)
MCH: 35.9 pg — ABNORMAL HIGH (ref 27.0–33.0)
MCHC: 33.9 g/dL (ref 32.0–36.0)
MCV: 105.9 fL — ABNORMAL HIGH (ref 80.0–100.0)
MPV: 10.8 fL (ref 7.5–12.5)
Monocytes Relative: 11.3 %
Neutro Abs: 2203 {cells}/uL (ref 1500–7800)
Neutrophils Relative %: 43.2 %
Platelets: 249 10*3/uL (ref 140–400)
RBC: 3.76 10*6/uL — ABNORMAL LOW (ref 4.20–5.80)
RDW: 13.8 % (ref 11.0–15.0)
Total Lymphocyte: 37.7 %
WBC: 5.1 10*3/uL (ref 3.8–10.8)

## 2023-07-21 LAB — TSH: TSH: 1.84 m[IU]/L (ref 0.40–4.50)

## 2023-07-21 LAB — LIPID PANEL
Cholesterol: 137 mg/dL (ref <200)
HDL: 66 mg/dL (ref >=40)
LDL Cholesterol (Calc): 55 mg/dL
Non-HDL Cholesterol (Calc): 71 mg/dL (ref <130)
Total CHOL/HDL Ratio: 2.1 (calc) (ref <5.0)
Triglycerides: 78 mg/dL (ref <150)

## 2023-07-21 LAB — HEMOGLOBIN A1C
Hgb A1c MFr Bld: 5 %{Hb} (ref <5.7)
Mean Plasma Glucose: 97 mg/dL
eAG (mmol/L): 5.4 mmol/L

## 2023-07-21 LAB — PSA: PSA: 0.86 ng/mL (ref <=4.00)

## 2023-07-21 LAB — BRAIN NATRIURETIC PEPTIDE: Brain Natriuretic Peptide: 147 pg/mL — ABNORMAL HIGH (ref <100)

## 2023-07-21 NOTE — Progress Notes (Unsigned)
Enrolled for [employer] to mail a ZIO XT long term holter monitor to the patients address on file.   Dr. Chandrasekhar to read. 

## 2023-07-21 NOTE — Addendum Note (Signed)
Addended by: Park Meo on: 07/21/2023 02:29 PM   Modules accepted: Orders

## 2023-07-25 ENCOUNTER — Encounter (INDEPENDENT_AMBULATORY_CARE_PROVIDER_SITE_OTHER): Payer: Self-pay | Admitting: *Deleted

## 2023-07-26 ENCOUNTER — Telehealth: Payer: Self-pay | Admitting: Family Medicine

## 2023-07-26 NOTE — Telephone Encounter (Signed)
No contact number for patient on file. Referral coordinator Bonita Quin requested patient's phone number for Thayer Ohm to schedule appointment re: referral on file.   Outbound call placed to patient's emergency contact number of [phone] [facility]). Man who answered stated he doesn't know anyone named Rishawn and doesn't know how we got his number.   Left message to return call on voicemail of other emergency contact (sister) Venda Rodes.

## 2023-07-28 DIAGNOSIS — I491 Atrial premature depolarization: Secondary | ICD-10-CM

## 2023-07-28 DIAGNOSIS — I499 Cardiac arrhythmia, unspecified: Secondary | ICD-10-CM

## 2023-07-28 DIAGNOSIS — I4891 Unspecified atrial fibrillation: Secondary | ICD-10-CM

## 2023-08-02 ENCOUNTER — Telehealth (INDEPENDENT_AMBULATORY_CARE_PROVIDER_SITE_OTHER): Payer: Self-pay | Admitting: *Deleted

## 2023-08-02 NOTE — Telephone Encounter (Signed)
Referring MD/PCP: Kurtis Bushman  Insurance: Tourist information centre manager  Best Phone Number: [phone]  Reason for the colonoscopy screening  Has patient had this procedure before?  no  If so, when, by whom and where?    Is there a family history of colon cancer?  no  Who?  What age when diagnosed?    Is patient diabetic? If yes, Type 1 or Type 2   no      Does patient have prosthetic heart valve or mechanical valve?  no  Do you have a pacemaker/defibrillator?  no  Has patient ever had endocarditis/atrial fibrillation? no  Has patient had joint replacement within last 12 months?  no  Is patient constipated or do they take laxatives? no  Does patient have a history of alcohol/drug use?  Yes, alcohol  Does patient use oxygen? no  Have you had a stroke/heart attack last 6 mths? no  Do you take medicine for weight loss?  no  For male patients,: have you had a hysterectomy no                      are you post menopausal no                      do you still have your menstrual cycle no  Do you take any blood-thinning medications such as: (aspirin, warfarin, Plavix, Aggrenox)  no  If yes we need the name, milligram, dosage and who is prescribing doctor   Medications: none  Allergies: NKDA  [facility] [address]

## 2023-08-03 NOTE — Telephone Encounter (Signed)
 Ok to schedule.  Room 2  Thanks,  Vista Lawman, MD Gastroenterology and Hepatology [facility] Gastroenterology

## 2023-08-04 ENCOUNTER — Telehealth: Payer: Self-pay | Admitting: Internal Medicine

## 2023-08-04 NOTE — Telephone Encounter (Signed)
Called pt sister to sch f/u referral   After scheduling she stated that she was concerned about pt's heart monitor because it does not have a green light on it and she wanted to know if that means it hasn't been on. Please advise.

## 2023-08-04 NOTE — Telephone Encounter (Signed)
Green light will only flash 5-6 times when monitor initially turned on. If you missed seeing the green light, push the button, holding it down for 12 seconds.  You should see the green light flash again to indicate the monitor had been turned on.

## 2023-08-11 NOTE — Telephone Encounter (Signed)
Will call once we get Oct schedule

## 2023-09-01 NOTE — Telephone Encounter (Signed)
LMTCB for pt WITH SISTER

## 2023-09-09 ENCOUNTER — Other Ambulatory Visit: Payer: Self-pay | Admitting: Family Medicine

## 2023-09-09 DIAGNOSIS — Z1212 Encounter for screening for malignant neoplasm of rectum: Secondary | ICD-10-CM

## 2023-09-09 DIAGNOSIS — Z1211 Encounter for screening for malignant neoplasm of colon: Secondary | ICD-10-CM

## 2023-09-21 ENCOUNTER — Ambulatory Visit: Payer: Medicaid Other

## 2023-10-03 ENCOUNTER — Encounter (HOSPITAL_COMMUNITY): Payer: Self-pay

## 2023-10-03 ENCOUNTER — Ambulatory Visit (HOSPITAL_COMMUNITY)
Admission: EM | Admit: 2023-10-03 | Discharge: 2023-10-03 | Disposition: A | Discharge status: Home / Self Care | Payer: Medicaid Other | Attending: Internal Medicine | Admitting: Internal Medicine

## 2023-10-03 ENCOUNTER — Ambulatory Visit (INDEPENDENT_AMBULATORY_CARE_PROVIDER_SITE_OTHER): Payer: Medicaid Other

## 2023-10-03 DIAGNOSIS — M25462 Effusion, left knee: Secondary | ICD-10-CM | POA: Diagnosis not present

## 2023-10-03 DIAGNOSIS — S82002A Unspecified fracture of left patella, initial encounter for closed fracture: Secondary | ICD-10-CM | POA: Diagnosis not present

## 2023-10-03 MED ORDER — CEPHALEXIN 250 MG PO CAPS
250.0000 mg | ORAL_CAPSULE | Freq: Four times a day (QID) | ORAL | 0 refills | Status: AC
Start: 2023-10-03 — End: ?

## 2023-10-03 MED ORDER — HYDROCODONE-ACETAMINOPHEN 5-325 MG PO TABS: 1.0000 | ORAL_TABLET | Freq: Four times a day (QID) | ORAL | 0 refills | Status: AC | PRN

## 2023-10-03 NOTE — ED Provider Notes (Signed)
[facility]    CSN: [id number] Arrival date & time: 10/03/23  1234      History   Chief Complaint Chief Complaint  Patient presents with   Fall    HPI Spencer Chapman is a 62 y.o. male.   The history is provided by the patient.  Fall  Left knee pain, states he tripped over a wire struck his knee on a brick yesterday, crack.  Since then has had pain and swelling left knee, admits pain with bearing weight.  Notes redness and warmth.  Denies breaks in the skin, fever, chills.  Past Medical History:  Diagnosis Date   Emphysema of lung [facility])     Patient Active Problem List   Diagnosis Date Noted   Encounter to establish care with new doctor 07/20/2023   Aortic atherosclerosis (HCC) 07/20/2023   Pulmonary emphysema (HCC) 07/20/2023   Cigarette nicotine dependence without complication 07/20/2023   Weakness of both lower extremities 07/20/2023   Irregular heart rhythm 07/20/2023   Multiple rib fractures 04/24/2023    History reviewed. No pertinent surgical history.     Home Medications    Prior to Admission medications   Medication Sig Start Date End Date Taking? Authorizing Provider  cephALEXin (KEFLEX) 250 MG capsule Take 1 capsule (250 mg total) by mouth 4 (four) times daily. 10/03/23  Yes Meliton Rattan, PA  HYDROcodone-acetaminophen (NORCO/VICODIN) 5-325 MG tablet Take 1 tablet by mouth every 6 (six) hours as needed for up to 3 days for severe pain (pain score 7-10). 10/03/23 10/06/23 Yes Meliton Rattan, PA    Family History History reviewed. No pertinent family history.  Social History Social History   Tobacco Use   Smoking status: Every Day    Current packs/day: 1.00    Types: Cigarettes   Smokeless tobacco: Never  Substance Use Topics   Alcohol use: Yes    Alcohol/week: 21.0 standard drinks of alcohol    Types: 21 Cans of beer per week   Drug use: Never     Allergies   Patient has no known allergies.   Review of Systems Review  of Systems  Constitutional:  Negative for fever.  Musculoskeletal:  Positive for arthralgias, gait problem and joint swelling.  Skin:  Positive for color change. Negative for wound.     Physical Exam Triage Vital Signs ED Triage Vitals  Encounter Vitals Group     BP 10/03/23 1334 (!) 155/88     Systolic BP Percentile --      Diastolic BP Percentile --      Pulse Rate 10/03/23 1334 76     Resp 10/03/23 1334 16     Temp 10/03/23 1334 98.3 F (36.8 C)     Temp Source 10/03/23 1334 Oral     SpO2 10/03/23 1334 97 %     Weight 10/03/23 1334 115 lb (52.2 kg)     Height 10/03/23 1334 5\' 9"  (1.753 m)     Head Circumference --      Peak Flow --      Pain Score 10/03/23 1333 10     Pain Loc --      Pain Education --      Exclude from Growth Chart --    No data found.  Updated Vital Signs BP (!) 155/88 (BP Location: Right Arm)   Pulse 76   Temp 98.3 F (36.8 C) (Oral)   Resp 16   Ht 5\' 9"  (1.753 m)   Wt 115 lb (52.2  kg)   SpO2 97%   BMI 16.98 kg/m   Visual Acuity Right Eye Distance:   Left Eye Distance:   Bilateral Distance:    Right Eye Near:   Left Eye Near:    Bilateral Near:     Physical Exam Vitals and nursing note reviewed.  HENT:     Head: Normocephalic.  Eyes:     Conjunctiva/sclera: Conjunctivae normal.  Cardiovascular:     Rate and Rhythm: Normal rate.  Pulmonary:     Effort: Pulmonary effort is normal. No respiratory distress.  Musculoskeletal:     Left knee: Swelling, effusion and bony tenderness present. Decreased range of motion. No LCL laxity or MCL laxity.Normal alignment.     Left lower leg: No swelling.  Skin:    General: Skin is warm.     Findings: Erythema (Left knee diffuse pink discoloration local warmth, no wounds noted) present.  Neurological:     Mental Status: He is alert.      UC Treatments / Results  Labs (all labs ordered are listed, but only abnormal results are displayed) Labs Reviewed - No data to  display  EKG   Radiology No results found.  Procedures Procedures (including critical care time)  Medications Ordered in UC Medications - No data to display  Initial Impression / Assessment and Plan / UC Course  I have reviewed the triage vital signs and the nursing notes.  Pertinent labs & imaging results that were available during my care of the patient were reviewed by me and considered in my medical decision making (see chart for details).    62 year old with left knee pain and swelling after fall.  Being his knee on a brick.  Left knee x-ray independently viewed by me patella fracture. Findings reviewed with patient, knee immobilizer and crutches fitted by staff, patient counseled to follow-up with Ortho ASAP given number specialist.  Rx antibiotics given mild redness and warmth. Final Clinical Impressions(s) / UC Diagnoses   Final diagnoses:  Closed displaced fracture of left patella, unspecified fracture morphology, initial encounter   Discharge Instructions   None    ED Prescriptions     Medication Sig Dispense Auth. Provider   cephALEXin (KEFLEX) 250 MG capsule Take 1 capsule (250 mg total) by mouth 4 (four) times daily. 28 capsule Meliton Rattan, [place]   HYDROcodone-acetaminophen (NORCO/VICODIN) 5-325 MG tablet Take 1 tablet by mouth every 6 (six) hours as needed for up to 3 days for severe pain (pain score 7-10). 12 tablet Meliton Rattan, [place]      I have reviewed the PDMP during this encounter.   Meliton Rattan, [place] 10/03/23 1428

## 2023-10-03 NOTE — ED Triage Notes (Signed)
Patient here today with c/o left knee pain after falling yesterday. Patient states that he tripped over a wire and his left knee hit a concrete block and he felt a crack.

## 2023-10-24 ENCOUNTER — Encounter: Payer: Self-pay | Admitting: Physician Assistant

## 2023-10-24 DIAGNOSIS — I471 Supraventricular tachycardia, unspecified: Secondary | ICD-10-CM

## 2023-10-24 DIAGNOSIS — I340 Nonrheumatic mitral (valve) insufficiency: Secondary | ICD-10-CM

## 2023-10-24 HISTORY — DX: Supraventricular tachycardia, unspecified: I47.10

## 2023-10-24 HISTORY — DX: Nonrheumatic mitral (valve) insufficiency: I34.0
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
## 2023-10-25 ENCOUNTER — Ambulatory Visit: Payer: Medicaid Other | Attending: Physician Assistant | Admitting: Physician Assistant

## 2023-10-25 DIAGNOSIS — I340 Nonrheumatic mitral (valve) insufficiency: Secondary | ICD-10-CM

## 2023-10-25 DIAGNOSIS — I471 Supraventricular tachycardia, unspecified: Secondary | ICD-10-CM

## 2023-10-26 ENCOUNTER — Encounter: Payer: Self-pay | Admitting: Physician Assistant

## 2023-11-16 ENCOUNTER — Encounter: Payer: Self-pay | Admitting: Emergency Medicine

## 2023-12-10 ENCOUNTER — Encounter (HOSPITAL_COMMUNITY): Payer: Self-pay | Admitting: *Deleted

## 2023-12-10 ENCOUNTER — Emergency Department (HOSPITAL_COMMUNITY): Payer: Medicaid Other

## 2023-12-10 ENCOUNTER — Emergency Department (HOSPITAL_COMMUNITY)
Admission: EM | Admit: 2023-12-10 | Discharge: 2023-12-10 | Disposition: A | Discharge status: Home / Self Care | Payer: Medicaid Other | Attending: Emergency Medicine | Admitting: Emergency Medicine

## 2023-12-10 ENCOUNTER — Other Ambulatory Visit: Payer: Self-pay

## 2023-12-10 DIAGNOSIS — S42402A Unspecified fracture of lower end of left humerus, initial encounter for closed fracture: Secondary | ICD-10-CM | POA: Diagnosis not present

## 2023-12-10 DIAGNOSIS — M79632 Pain in left forearm: Secondary | ICD-10-CM | POA: Diagnosis not present

## 2023-12-10 DIAGNOSIS — Z720 Tobacco use: Secondary | ICD-10-CM | POA: Diagnosis not present

## 2023-12-10 DIAGNOSIS — S52022A Displaced fracture of olecranon process without intraarticular extension of left ulna, initial encounter for closed fracture: Secondary | ICD-10-CM | POA: Diagnosis not present

## 2023-12-10 DIAGNOSIS — W010XXA Fall on same level from slipping, tripping and stumbling without subsequent striking against object, initial encounter: Secondary | ICD-10-CM | POA: Diagnosis not present

## 2023-12-10 DIAGNOSIS — M25522 Pain in left elbow: Secondary | ICD-10-CM | POA: Diagnosis present

## 2023-12-10 DIAGNOSIS — M79642 Pain in left hand: Secondary | ICD-10-CM | POA: Diagnosis not present

## 2023-12-10 DIAGNOSIS — M25422 Effusion, left elbow: Secondary | ICD-10-CM | POA: Diagnosis not present

## 2023-12-10 MED ORDER — HYDROCODONE-ACETAMINOPHEN 5-325 MG PO TABS
1.0000 | ORAL_TABLET | Freq: Once | ORAL | Status: AC
  Administered 2023-12-10: 1 via ORAL
  Filled 2023-12-10: qty 1

## 2023-12-10 MED ORDER — KETOROLAC TROMETHAMINE 15 MG/ML IJ SOLN
15.0000 mg | Freq: Once | INTRAMUSCULAR | Status: AC
  Administered 2023-12-10: 15 mg via INTRAMUSCULAR
  Filled 2023-12-10: qty 1

## 2023-12-10 MED ORDER — HYDROCODONE-ACETAMINOPHEN 5-325 MG PO TABS: 1.0000 | ORAL_TABLET | Freq: Four times a day (QID) | ORAL | 0 refills | Status: AC | PRN

## 2023-12-10 NOTE — ED Notes (Signed)
 Pt in xray

## 2023-12-10 NOTE — ED Provider Triage Note (Signed)
 Emergency Medicine Provider Triage Evaluation Note  Spencer Chapman , a 63 y.o. male  was evaluated in triage.  Pt complains of left elbow pain after trip and fall.  Review of Systems  Positive: Elbow pain Negative: Numbness, head injury  Physical Exam  BP 102/70 (BP Location: Right Arm)   Pulse 71   Temp 98.1 F (36.7 C)   Resp 19   Ht 5' 9 (1.753 m)   Wt 52.2 kg   SpO2 94%   BMI 16.98 kg/m  Gen:   Awake, no distress   Resp:  Normal effort  MSK:   Moves extremities without difficulty  Other:  Ecchymosis left elbow and forearm.  Normal range of motion left elbow and wrist.  Radial pulse intact.  Medical Decision Making  Medically screening exam initiated at 3:42 PM.  Appropriate orders placed.  Spencer Chapman was informed that the remainder of the evaluation will be completed by another provider, this initial triage assessment does not replace that evaluation, and the importance of remaining in the ED until their evaluation is complete.     Spencer Chapman LABOR, [place] 12/10/23 [phone]

## 2023-12-10 NOTE — ED Triage Notes (Signed)
 Pt fell the other day after tripping, pt with swelling and bruising noted to left forearm. Denies hitting his head.

## 2023-12-10 NOTE — ED Provider Notes (Signed)
 [place] EMERGENCY DEPARTMENT AT [facility] Provider Note   CSN: [id number] Arrival date & time: 12/10/23  1457     History  Chief Complaint  Patient presents with   Spencer Chapman is a 63 y.o. male.  He has PMH of SVT, mitral regurgitation, emphysema, tobacco abuse.  Presents the ER for left elbow pain with swelling of his forearm, states a couple of days ago he tripped and fell directly on his left elbow, has had worsening swelling and bruising now into his forearm since that time, not able to sleep due to the pain, denies numbness or tingling.  Denies limited range of motion.   Fall       Home Medications Prior to Admission medications   Medication Sig Start Date End Date Taking? Authorizing Provider  cephALEXin  (KEFLEX ) 250 MG capsule Take 1 capsule (250 mg total) by mouth 4 (four) times daily. 10/03/23   Acevedo, Angela, PA      Allergies    Patient has no known allergies.    Review of Systems   Review of Systems  Physical Exam Updated Vital Signs BP 102/70 (BP Location: Right Arm)   Pulse 71   Temp 98.1 F (36.7 C)   Resp 19   Ht 5' 9 (1.753 m)   Wt 52.2 kg   SpO2 94%   BMI 16.98 kg/m  Physical Exam Vitals and nursing note reviewed.  Constitutional:      General: He is not in acute distress.    Appearance: He is well-developed.  HENT:     Head: Normocephalic and atraumatic.     Mouth/Throat:     Mouth: Mucous membranes are moist.  Eyes:     Conjunctiva/sclera: Conjunctivae normal.  Cardiovascular:     Rate and Rhythm: Normal rate and regular rhythm.     Heart sounds: No murmur heard. Pulmonary:     Effort: Pulmonary effort is normal. No respiratory distress.     Breath sounds: Normal breath sounds.  Abdominal:     Palpations: Abdomen is soft.     Tenderness: There is no abdominal tenderness.  Musculoskeletal:        General: Swelling present.     Cervical back: Neck supple.     Comments: Swelling of left elbow and  ecchymosis of left forearm, tenderness to left forearm and dorsal left hand, no snuffbox tenderness of the wrist, normal range of motion of the wrist and elbow, no shoulder tenderness or limitation of range of motion of left shoulder.  Left radial pulses bounding.  Sensation intact throughout left upper extremity to light touch.  Skin:    General: Skin is warm and dry.     Capillary Refill: Capillary refill takes less than 2 seconds.  Neurological:     General: No focal deficit present.     Mental Status: He is alert and oriented to person, place, and time.  Psychiatric:        Mood and Affect: Mood normal.     ED Results / Procedures / Treatments   Labs (all labs ordered are listed, but only abnormal results are displayed) Labs Reviewed - No data to display  EKG None  Radiology DG Hand 2 View Left Result Date: 12/10/2023 CLINICAL DATA:  Pain after a fall. EXAM: LEFT HAND - 2 VIEW COMPARISON:  Forearm radiographs performed the same day. FINDINGS: There is no evidence of fracture or dislocation. There is no evidence of arthropathy or other focal  bone abnormality. Soft tissues are unremarkable. IMPRESSION: Negative. Electronically Signed   By: Norman Hopper M.D.   On: 12/10/2023 17:20   DG Elbow Complete Left Result Date: 12/10/2023 CLINICAL DATA:  Fall EXAM: LEFT ELBOW - COMPLETE 3+ VIEW COMPARISON:  None Available. FINDINGS: There is an acute olecranon fracture with fracture fragments distracted 7 mm. Joint effusion is present. There is no definite dislocation, although lateral view is limited secondary to patient positioning. There is diffuse soft tissue swelling of the elbow and visualized forearm. Joint spaces appear well maintained. There is a 2 x 2 x 2 mm radiopaque foreign body in the ventral soft tissues of the mid forearm. IMPRESSION: 1. Acute olecranon fracture. 2. Joint effusion. 3. Diffuse soft tissue swelling. 4. 2 mm radiopaque foreign body in the ventral soft tissues of the mid  forearm. Electronically Signed   By: Greig Pique M.D.   On: 12/10/2023 17:20   DG Forearm Left Result Date: 12/10/2023 CLINICAL DATA:  Fall with forearm pain and bruising. EXAM: LEFT FOREARM - 2 VIEW COMPARISON:  None Available. FINDINGS: There is a displaced acute fracture of the olecranon. There is likely an elbow joint effusion. There is soft tissue swelling surrounding the elbow. There is a 3 mm radiopaque foreign body in the soft tissues of the forearm. IMPRESSION: Displaced acute fracture of the olecranon. This can be further characterized with dedicated elbow radiographs. Electronically Signed   By: Norman Hopper M.D.   On: 12/10/2023 16:35    Procedures .Splint Application  Date/Time: 12/10/2023 6:59 PM  Performed by: Suellen Sherran LABOR, PA-C Authorized by: Suellen Sherran LABOR, PA-C   Consent:    Consent obtained:  Verbal   Consent given by:  Patient   Risks discussed:  Discoloration, numbness and swelling Universal protocol:    Patient identity confirmed:  Verbally with patient Pre-procedure details:    Distal neurologic exam:  Normal   Distal perfusion: distal pulses strong and brisk capillary refill   Procedure details:    Location:  Elbow   Elbow location:  L elbow   Strapping: no     Splint type:  Long arm   Supplies:  Elastic bandage, cotton padding, fiberglass and sling   Attestation: Splint applied and adjusted personally by me   Post-procedure details:    Distal neurologic exam:  Normal   Distal perfusion: distal pulses strong and brisk capillary refill     Procedure completion:  Tolerated well, no immediate complications     Medications Ordered in ED Medications - No data to display  ED Course/ Medical Decision Making/ A&P                                 Medical Decision Making This patient presents to the ED for concern of left elbow pain after fall, this involves an extensive number of treatment options, and is a complaint that carries with it a high risk  of complications and morbidity.  The differential diagnosis includes fracture, contusion, sprain, dislocation, other   Co morbidities that complicate the patient evaluation :   Emphysema, mitral regurgitation, smoking   Additional history obtained:  Additional history obtained from EMR External records from outside source obtained and reviewed including prior notes     Imaging Studies ordered:  I ordered imaging studies including x-ray left elbow, forearm and hand shows left olecranon fracture with 7 mm of distraction and joint effusion.  Soft tissue  foreign body of forearm, no fracture of the forearm or hand I independently visualized and interpreted imaging within scope of identifying emergent findings  I agree with the radiologist interpretation   Cardiac Monitoring: / EKG:  The patient was maintained on a cardiac monitor.  I personally viewed and interpreted the cardiac monitored which showed an underlying rhythm of: Sinus rhythm   Consultations Obtained:  I requested consultation with the with a peak surgeon Dr. Margrette,  and discussed lab and imaging findings as well as pertinent plan - they recommend: Follow-up in clinic-posterior splint   Problem List / ED Course / Critical interventions / Medication management  Left elbow fracture after mechanical fall several days ago shows 7 mm of distraction and a joint effusion but no dislocation, no ulnar nerve injury on exam, normal range of motion, normal hand range of motion and function patient to make thumbs up, okay and abduct and adduct fingers.  No decreased sensation.  He is not on blood thinners did not have a head strike.  Better than posterior splint with instructions for orthopedic follow-up and splint care.  Given Norco for pain as the pain has been keeping him awake at night.  Reports that he does fall a lot, given strict instructions to take minimal amounts of the hydrocodone  due to increased risk of falls, feeling  member at bedside instructed to watch him closely. I ordered medication including norco  for pain  Reevaluation of the patient after these medicines showed that the patient improved I have reviewed the patients home medicines and have made adjustments as needed       Amount and/or Complexity of Data Reviewed Radiology: ordered.  Risk Prescription drug management.           Final Clinical Impression(s) / ED Diagnoses Final diagnoses:  None    Rx / DC Orders ED Discharge Orders     None         Suellen Sherran DELENA DEVONNA 12/10/23 1950    Francesca Elsie CROME, MD 12/10/23 2117

## 2023-12-10 NOTE — Discharge Instructions (Addendum)
 Seen today for left elbow injury after a fall.  You were noted to have a fracture of your olecranon.  We put you in a splint and sling, keep your arm in the splint do not get it wet or take it off.  Is important for you to follow-up with orthopedics.  They can determine if you will need surgery to repair this.  X-ray did show small metallic foreign body in the soft tissue though this is likely old since he did not have any skin injuries.   You were prescribed opiate pain medication. This can have many sided effect such as drowsiness, slowed breathing, and conspitation. Do not take it with other medications that cause drowsiness, or drink alcohol  while taking it. Take a stool softener over the counter while taking it.  Since you have frequent falls please make sure you take this as little as possible.  Since they can make you sleepy can increase your risk of falls.

## 2023-12-15 ENCOUNTER — Other Ambulatory Visit: Payer: Self-pay

## 2023-12-15 DIAGNOSIS — Z1211 Encounter for screening for malignant neoplasm of colon: Secondary | ICD-10-CM

## 2023-12-26 ENCOUNTER — Ambulatory Visit: Payer: Medicaid Other | Admitting: Family Medicine

## 2024-01-25 ENCOUNTER — Encounter (INDEPENDENT_AMBULATORY_CARE_PROVIDER_SITE_OTHER): Payer: Self-pay | Admitting: *Deleted

## 2024-01-27 ENCOUNTER — Ambulatory Visit: Payer: Self-pay | Admitting: Family Medicine
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
## 2024-01-29 ENCOUNTER — Emergency Department (HOSPITAL_COMMUNITY): Payer: Medicaid Other

## 2024-01-29 ENCOUNTER — Other Ambulatory Visit: Payer: Self-pay

## 2024-01-29 ENCOUNTER — Emergency Department (HOSPITAL_COMMUNITY)
Admission: EM | Admit: 2024-01-29 | Discharge: 2024-01-29 | Disposition: A | Discharge status: Home / Self Care | Payer: Medicaid Other | Attending: Emergency Medicine | Admitting: Emergency Medicine

## 2024-01-29 DIAGNOSIS — M19032 Primary osteoarthritis, left wrist: Secondary | ICD-10-CM | POA: Diagnosis not present

## 2024-01-29 DIAGNOSIS — M25532 Pain in left wrist: Secondary | ICD-10-CM | POA: Insufficient documentation
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
## 2024-01-29 NOTE — Discharge Instructions (Signed)
 Your wrist is not broken.  You may be given Tylenol or ibuprofen as needed for mild pain.
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
## 2024-01-31 ENCOUNTER — Ambulatory Visit: Payer: Medicaid Other | Admitting: Family Medicine
